# Patient Record
Sex: Male | Born: 1975 | Race: White | Hispanic: No | Smoking: Current every day smoker
Health system: Southern US, Community
[De-identification: ages and names within clinical notes are randomized; demographics above are authoritative.]

## PROBLEM LIST (undated history)

## (undated) DIAGNOSIS — G4730 Sleep apnea, unspecified: Secondary | ICD-10-CM

## (undated) DIAGNOSIS — G5600 Carpal tunnel syndrome, unspecified upper limb: Secondary | ICD-10-CM

## (undated) DIAGNOSIS — M5030 Other cervical disc degeneration, unspecified cervical region: Secondary | ICD-10-CM

---

## 2015-06-08 ENCOUNTER — Emergency Department (HOSPITAL_COMMUNITY)
Admission: EM | Admit: 2015-06-08 | Discharge: 2015-06-08 | Disposition: A | Discharge status: Home / Self Care | Payer: Non-veteran care | Attending: Emergency Medicine | Admitting: Emergency Medicine

## 2015-06-08 ENCOUNTER — Encounter (HOSPITAL_COMMUNITY): Payer: Self-pay | Admitting: *Deleted

## 2015-06-08 ENCOUNTER — Emergency Department (HOSPITAL_COMMUNITY): Payer: Non-veteran care

## 2015-06-08 DIAGNOSIS — M545 Low back pain: Secondary | ICD-10-CM | POA: Insufficient documentation

## 2015-06-08 DIAGNOSIS — F17200 Nicotine dependence, unspecified, uncomplicated: Secondary | ICD-10-CM | POA: Diagnosis not present

## 2015-06-08 DIAGNOSIS — M62838 Other muscle spasm: Secondary | ICD-10-CM | POA: Insufficient documentation

## 2015-06-08 DIAGNOSIS — G8929 Other chronic pain: Secondary | ICD-10-CM | POA: Diagnosis not present

## 2015-06-08 DIAGNOSIS — M25511 Pain in right shoulder: Secondary | ICD-10-CM | POA: Diagnosis not present

## 2015-06-08 DIAGNOSIS — M549 Dorsalgia, unspecified: Secondary | ICD-10-CM

## 2015-06-08 HISTORY — DX: Sleep apnea, unspecified: G47.30

## 2015-06-08 HISTORY — DX: Other cervical disc degeneration, unspecified cervical region: M50.30

## 2015-06-08 HISTORY — DX: Carpal tunnel syndrome, unspecified upper limb: G56.00

## 2015-06-08 MED ORDER — CYCLOBENZAPRINE HCL 10 MG PO TABS: 10.0000 mg | ORAL_TABLET | Freq: Two times a day (BID) | ORAL | Status: AC | PRN

## 2015-06-08 MED ORDER — CYCLOBENZAPRINE HCL 10 MG PO TABS
10.0000 mg | ORAL_TABLET | Freq: Once | ORAL | Status: AC
  Administered 2015-06-08: 10 mg via ORAL
  Filled 2015-06-08: qty 1

## 2015-06-08 MED ORDER — PREDNISONE 20 MG PO TABS
60.0000 mg | ORAL_TABLET | Freq: Once | ORAL | Status: AC
  Administered 2015-06-08: 60 mg via ORAL
  Filled 2015-06-08: qty 3

## 2015-06-08 MED ORDER — OXYCODONE-ACETAMINOPHEN 5-325 MG PO TABS
1.0000 | ORAL_TABLET | ORAL | Status: DC | PRN
  Administered 2015-06-08: 1 via ORAL

## 2015-06-08 MED ORDER — PREDNISONE 10 MG (21) PO TBPK: 10.0000 mg | ORAL_TABLET | Freq: Every day | ORAL | Status: AC

## 2015-06-08 MED ORDER — OXYCODONE-ACETAMINOPHEN 5-325 MG PO TABS
ORAL_TABLET | ORAL | Status: AC
  Filled 2015-06-08: qty 1

## 2015-06-08 NOTE — Discharge Instructions (Signed)
Do not drive while taking the muscle relaxant as it will make you sleepy. °

## 2015-06-08 NOTE — ED Provider Notes (Signed)
CSN: [id number]     Arrival date & time 06/08/15  1104 History  By signing my name below, I, Jorge Phelps, attest that this documentation has been prepared under the direction and in the presence of [facility] Neese. NP-C.  Electronically Signed: Phillis HaggisGabriella Phelps, ED Scribe. 06/08/2015. 1:15 PM.   Chief Complaint  Patient presents with  . Back Pain   Patient is a 40 y.o. male presenting with back pain. The history is provided by the patient. No language interpreter was used.  Back Pain Location:  Lumbar spine Quality:  Aching Radiates to:  Does not radiate Pain severity:  Moderate Onset quality:  Gradual Duration:  2 days Timing:  Constant Progression:  Worsening Chronicity:  Chronic Ineffective treatments:  None tried Associated symptoms: no bladder incontinence and no bowel incontinence   HPI Comments: Jorge Phelps is a 40 y.o. male with a hx of DDD L1-L5 who presents to the Emergency Department complaining of an episode of worsening chronic lower back pain onset 2 days ago. He rates his pain 9/10. He reports that this pain is similar to his DDD pain but more severe. He has not tried anything for his pain. Pt was given a percocet in Triage to mild relief. Pt reports that he has not been evaluated for his DDD for over a year. 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 He does not have a PCP because he just moved to the area. He denies injury, heavy lifting, falls, bladder or bowel incontinence.   Past Medical History  Diagnosis Date  . DDD (degenerative disc disease), cervical   . Sleep apnea   . Carpal tunnel syndrome    History reviewed. No pertinent past surgical history. History reviewed. No pertinent family history. Social History  Substance Use Topics  . Smoking status: Current Every Day Smoker  . Smokeless tobacco: Never Used  . Alcohol Use: No    Review of Systems  Gastrointestinal: Negative for bowel  incontinence.  Genitourinary: Negative for bladder incontinence.  Musculoskeletal: Positive for back pain.  All other systems reviewed and are negative.     Allergies  Review of patient's allergies indicates no known allergies.  Home Medications   Prior to Admission medications   Medication Sig Start Date End Date Taking? Authorizing Provider  cyclobenzaprine (FLEXERIL) 10 MG tablet Take 1 tablet (10 mg total) by mouth 2 (two) times daily as needed for muscle spasms. 06/08/15   Hope Orlene OchM Neese, NP  predniSONE (STERAPRED UNI-PAK 21 TAB) 10 MG (21) TBPK tablet Take 1 tablet (10 mg total) by mouth daily. Starting 06/09/15 take 5 tablets PO day one then, 4, 3, 2, 1 06/08/15   Hope M Neese, NP   BP 157/96 mmHg  Pulse 90  Temp(Src) 98.4 F (36.9 C) (Oral)  Resp 22  Ht 5\' 11"  (1.803 m)  Wt 114.034 kg  BMI 35.08 kg/m2  SpO2 100% Physical Exam  Constitutional: He is oriented to person, place, and time. He appears well-developed and well-nourished. No distress.  HENT:  Head: Normocephalic and atraumatic.  Eyes: EOM are normal. Pupils are equal, round, and reactive to light.  Neck: Normal range of motion. Neck supple.  Cardiovascular: Normal rate and regular rhythm.   Pulmonary/Chest: Effort normal. No respiratory distress. He has no wheezes. He has no rales.  Abdominal: Soft. Bowel sounds are normal. There is no tenderness.  Musculoskeletal: Normal range of motion. He exhibits no  edema.       Right shoulder: He exhibits tenderness and spasm. He exhibits normal range of motion, no deformity, no laceration, normal pulse and normal strength.       Lumbar back: He exhibits tenderness. He exhibits normal range of motion, no deformity, no spasm and normal pulse.  Pedal pulses 2+, adequate circulation good touch sensation.   Neurological: He is alert and oriented to person, place, and time. He has normal strength. No cranial nerve deficit or sensory deficit. Coordination and gait normal.  Reflex  Scores:      Bicep reflexes are 2+ on the right side and 2+ on the left side.      Brachioradialis reflexes are 2+ on the right side and 2+ on the left side.      Patellar reflexes are 2+ on the right side and 2+ on the left side.      Achilles reflexes are 2+ on the right side and 2+ on the left side. Skin: Skin is warm and dry.  Psychiatric: He has a normal mood and affect. His behavior is normal.  Nursing note and vitals reviewed.   ED Course  Procedures (including critical care time) DIAGNOSTIC STUDIES: Oxygen Saturation is 99% on RA, normal by my interpretation.    COORDINATION OF CARE: 1:13 PM-Discussed treatment plan which includes back x-ray and muscle relaxer with pt at bedside and pt agreed to plan.    Labs Review Labs Reviewed - No data to display  Imaging Review Dg Lumbar Spine Complete  06/08/2015  CLINICAL DATA:  Low back pain for 1 day.  No trauma EXAM: LUMBAR SPINE - COMPLETE 4+ VIEW COMPARISON:  None. FINDINGS: No evidence of fracture, endplate erosion, or focal bone lesion. Possible right-sided L5 pars defect without anterolisthesis. Mild spondylotic endplate changes. Mild relative L4-5 and L5-S1 disc narrowing. IMPRESSION: No acute finding. 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 Electronically Signed   By: Marnee Spring M.D.   On: 06/08/2015 14:02   I have personally reviewed and evaluated the images as part of my medical decision-making.   MDM  40 y.o. male with hx of chronic low back pain stable for d/c without focal neuro deficits. Will treat with prednisone and muscle relaxant and he is to follow up with ortho. He will return as needed for any problems.   Final diagnoses:  Chronic back pain   I personally performed the services described in this documentation, which was scribed in my presence. The recorded information has been reviewed and is accurate.    [address] [place], NP 06/08/15 1431  Jorge Dibbles, MD 06/08/15 [phone]

## 2015-06-08 NOTE — ED Notes (Signed)
Pt c/o back pain from DDD. Pt states he hurt it yesterday and is not getting any relief. Pt states he back hurts L1-L5. Pt moved here two months ago from [place] and has no PCP. Pt denies any incontinence

## 2017-10-18 IMAGING — DX DG LUMBAR SPINE COMPLETE 4+V
5 series · 5 of 5 positions shown · non-contrast
Comparison: None.

CLINICAL DATA: Low back pain for 1 day.  No trauma

EXAM:
LUMBAR SPINE - COMPLETE 4+ VIEW

[l-spine ap]
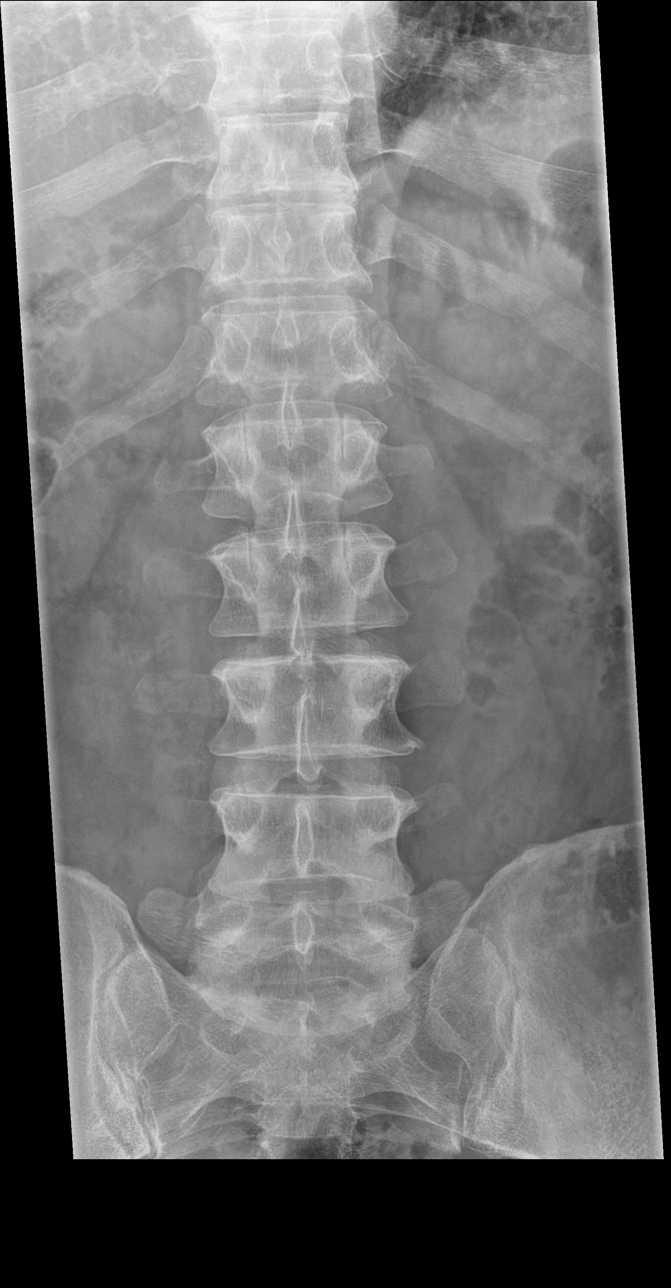

[l-spine obl (1 of 2)]
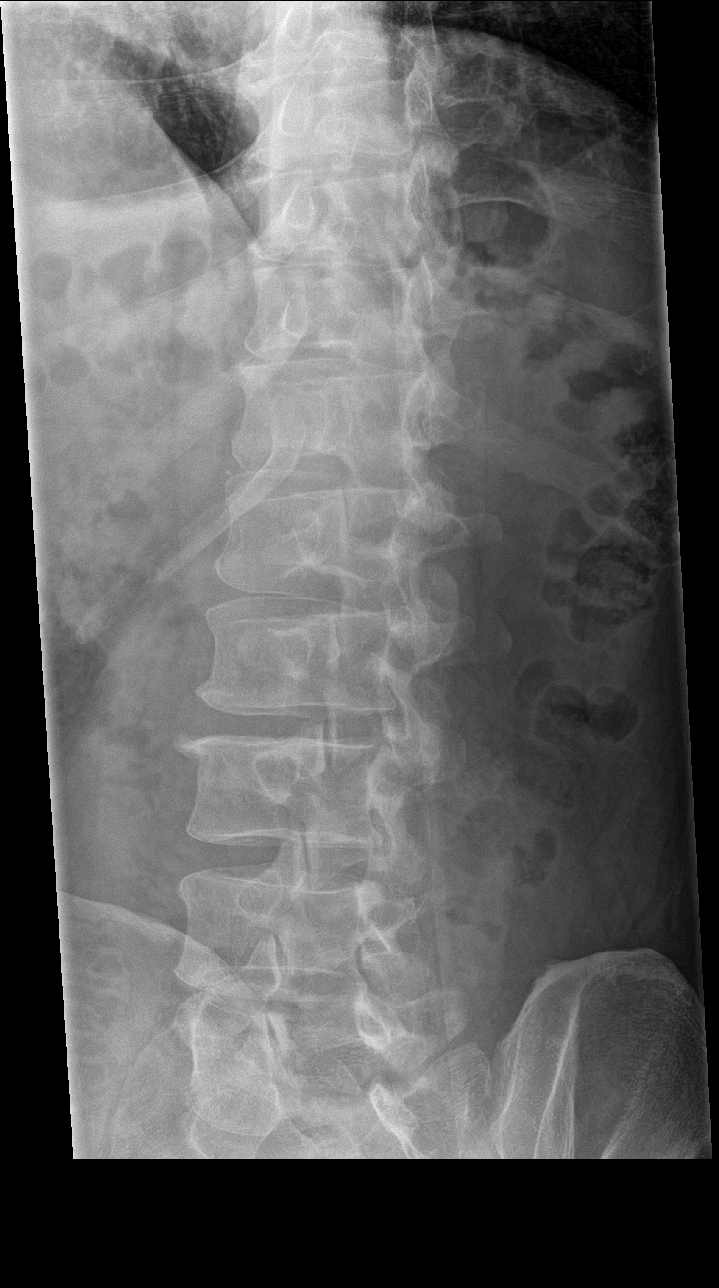

[l-spine lat]
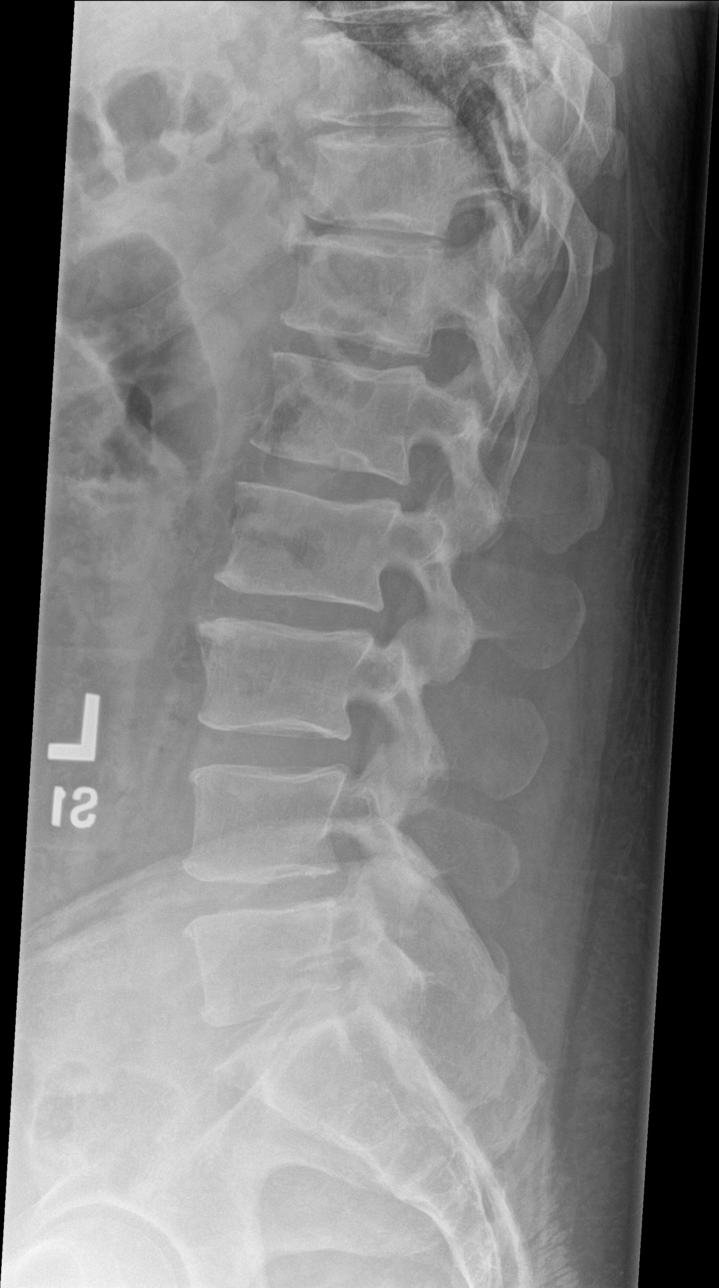

[l-spine spot]
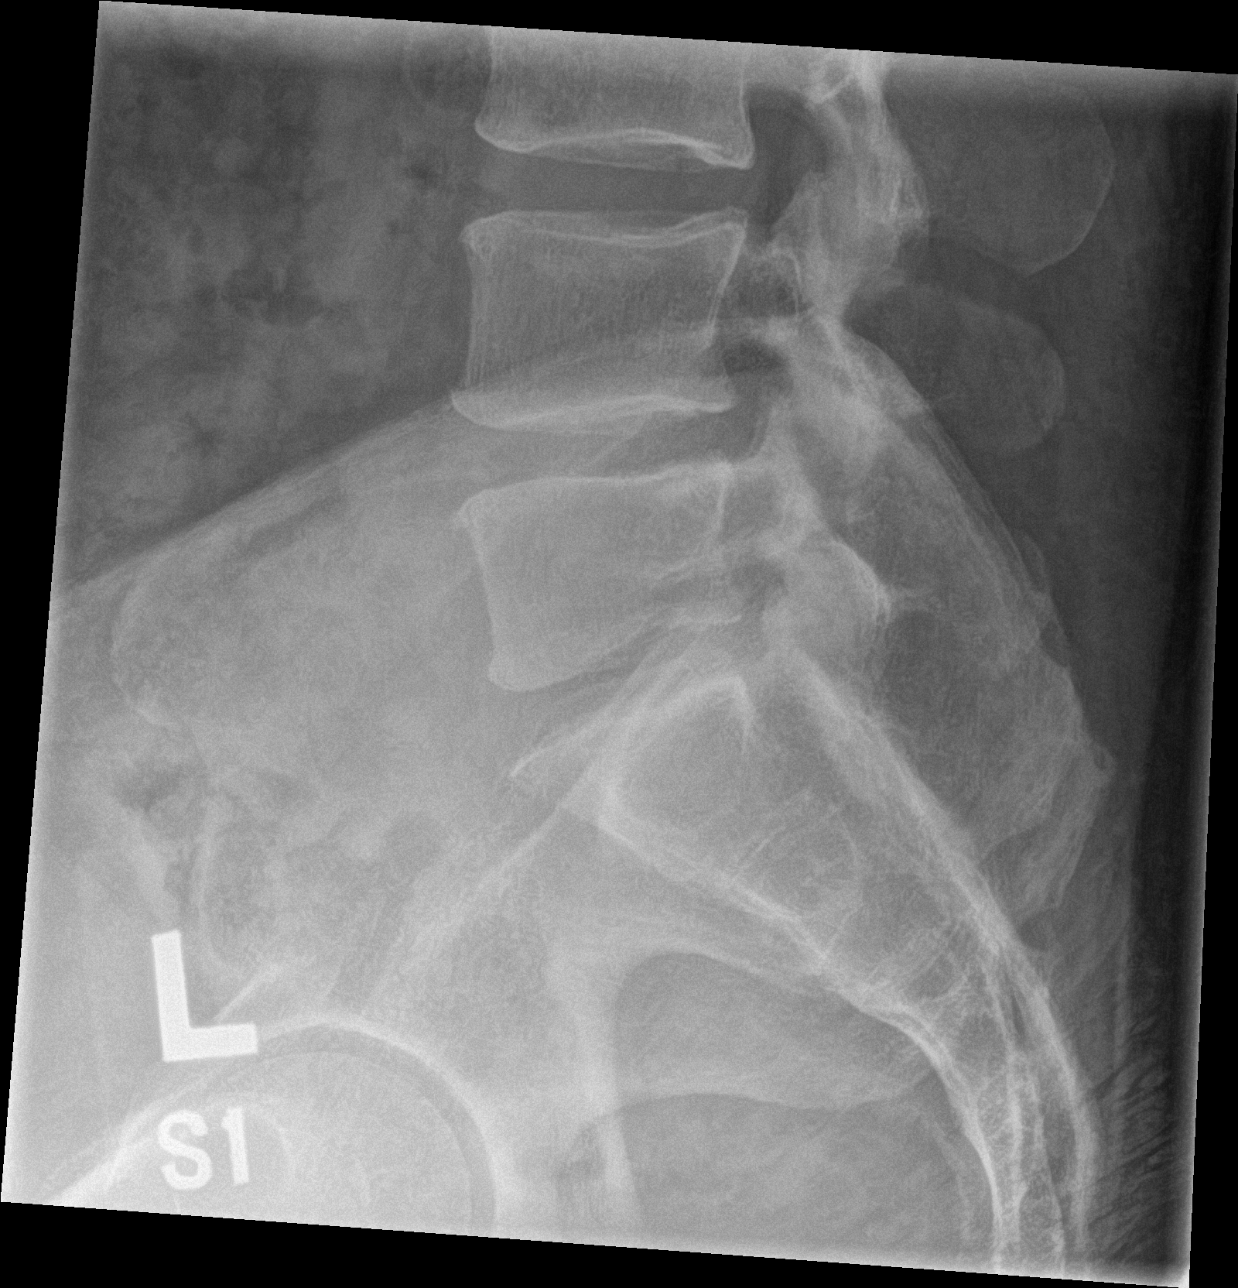

[l-spine obl (2 of 2)]
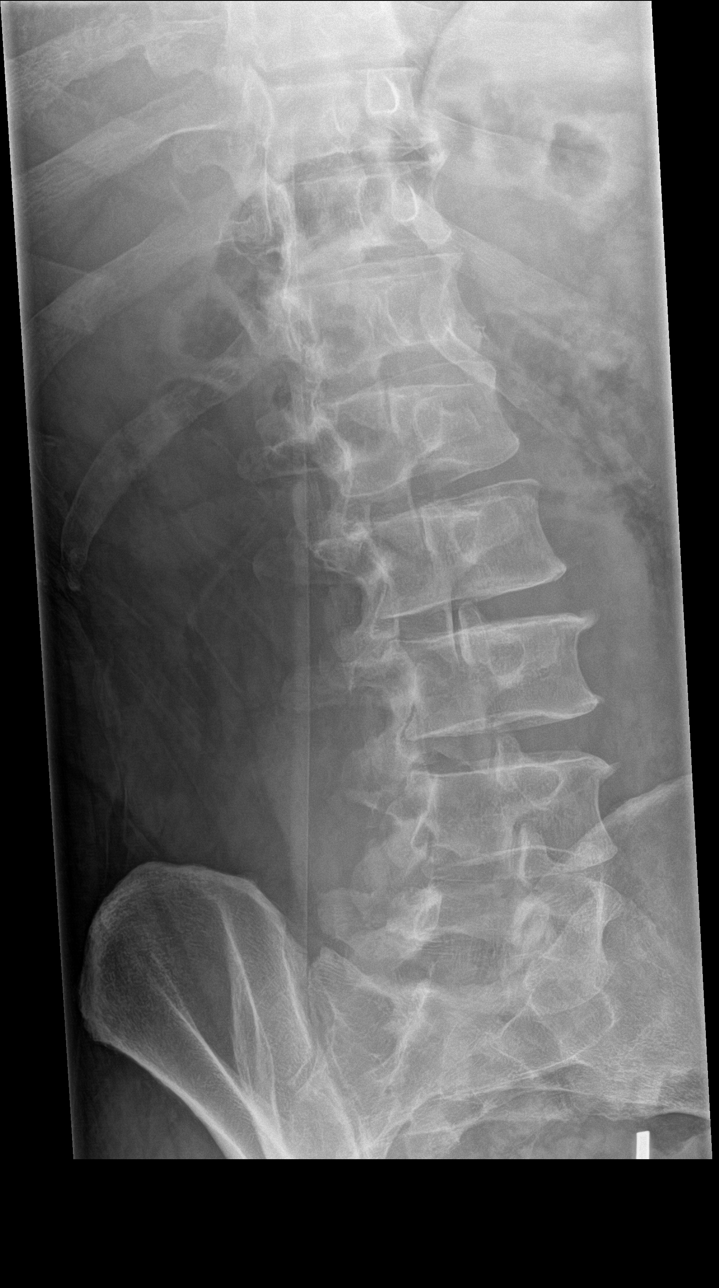

[5 of 5 positions shown; findings below may reference images not displayed]

FINDINGS: No evidence of fracture, endplate erosion, or focal bone lesion.
Possible right-sided L5 pars defect without anterolisthesis. Mild
spondylotic endplate changes. Mild relative L4-5 and L5-S1 disc
narrowing.
IMPRESSION: No acute finding.

Early spondylosis with lower lumbar disc narrowing.

## 2020-10-19 DEATH — deceased
